# Patient Record
Sex: Male | Born: 1955 | Race: White | Hispanic: Yes | Marital: Married | Smoking: Current every day smoker
Health system: Southern US, Community
[De-identification: ages and names within clinical notes are randomized; demographics above are authoritative.]

## PROBLEM LIST (undated history)

## (undated) DIAGNOSIS — M549 Dorsalgia, unspecified: Secondary | ICD-10-CM

## (undated) DIAGNOSIS — F32A Depression, unspecified: Secondary | ICD-10-CM

## (undated) DIAGNOSIS — N289 Disorder of kidney and ureter, unspecified: Secondary | ICD-10-CM

## (undated) DIAGNOSIS — E119 Type 2 diabetes mellitus without complications: Secondary | ICD-10-CM

## (undated) DIAGNOSIS — I10 Essential (primary) hypertension: Secondary | ICD-10-CM

## (undated) DIAGNOSIS — F329 Major depressive disorder, single episode, unspecified: Secondary | ICD-10-CM

## (undated) HISTORY — PX: BACK SURGERY: SHX140

---

## 2008-10-25 ENCOUNTER — Ambulatory Visit: Payer: Self-pay | Admitting: Vascular Surgery

## 2009-06-06 ENCOUNTER — Inpatient Hospital Stay (HOSPITAL_COMMUNITY): Admission: RE | Admit: 2009-06-06 | Discharge: 2009-06-09 | Payer: Self-pay | Admitting: Neurosurgery

## 2009-06-06 ENCOUNTER — Ambulatory Visit: Payer: Self-pay | Admitting: Vascular Surgery

## 2009-06-10 ENCOUNTER — Inpatient Hospital Stay (HOSPITAL_COMMUNITY): Admission: EM | Admit: 2009-06-10 | Discharge: 2009-06-13 | Payer: Self-pay | Admitting: Emergency Medicine

## 2009-06-21 ENCOUNTER — Ambulatory Visit (HOSPITAL_COMMUNITY): Admission: RE | Admit: 2009-06-21 | Discharge: 2009-06-21 | Payer: Self-pay | Admitting: Neurosurgery

## 2009-06-21 ENCOUNTER — Ambulatory Visit: Payer: Self-pay | Admitting: Vascular Surgery

## 2009-06-21 ENCOUNTER — Encounter (INDEPENDENT_AMBULATORY_CARE_PROVIDER_SITE_OTHER): Payer: Self-pay | Admitting: Neurosurgery

## 2009-07-05 ENCOUNTER — Ambulatory Visit: Payer: Self-pay | Admitting: Vascular Surgery

## 2009-07-21 ENCOUNTER — Emergency Department (HOSPITAL_COMMUNITY): Admission: EM | Admit: 2009-07-21 | Discharge: 2009-07-21 | Payer: Self-pay | Admitting: Emergency Medicine

## 2009-10-25 ENCOUNTER — Ambulatory Visit (HOSPITAL_COMMUNITY): Admission: RE | Admit: 2009-10-25 | Discharge: 2009-10-25 | Payer: Self-pay | Admitting: Neurosurgery

## 2009-10-25 ENCOUNTER — Encounter (INDEPENDENT_AMBULATORY_CARE_PROVIDER_SITE_OTHER): Payer: Self-pay | Admitting: Neurosurgery

## 2009-11-01 ENCOUNTER — Ambulatory Visit: Payer: Self-pay | Admitting: Vascular Surgery

## 2010-01-21 ENCOUNTER — Ambulatory Visit: Payer: Self-pay | Admitting: Diagnostic Radiology

## 2010-01-21 ENCOUNTER — Emergency Department (HOSPITAL_BASED_OUTPATIENT_CLINIC_OR_DEPARTMENT_OTHER): Admission: EM | Admit: 2010-01-21 | Discharge: 2010-01-21 | Payer: Self-pay | Admitting: Emergency Medicine

## 2010-04-15 ENCOUNTER — Emergency Department (HOSPITAL_COMMUNITY): Admission: EM | Admit: 2010-04-15 | Discharge: 2010-04-15 | Payer: Self-pay | Admitting: Emergency Medicine

## 2010-12-13 LAB — URINALYSIS, ROUTINE W REFLEX MICROSCOPIC
Bilirubin Urine: NEGATIVE
Glucose, UA: NEGATIVE mg/dL
Leukocytes, UA: NEGATIVE
Urobilinogen, UA: 0.2 mg/dL (ref 0.0–1.0)
pH: 6 (ref 5.0–8.0)

## 2011-01-01 LAB — URINALYSIS, ROUTINE W REFLEX MICROSCOPIC
Ketones, ur: NEGATIVE mg/dL
Nitrite: NEGATIVE
Protein, ur: NEGATIVE mg/dL
Specific Gravity, Urine: 1.017 (ref 1.005–1.030)
pH: 6.5 (ref 5.0–8.0)

## 2011-01-01 LAB — DIFFERENTIAL
Basophils Absolute: 0 10*3/uL (ref 0.0–0.1)
Basophils Relative: 1 % (ref 0–1)
Neutro Abs: 3.4 10*3/uL (ref 1.7–7.7)
Neutrophils Relative %: 56 % (ref 43–77)

## 2011-01-01 LAB — CBC
HCT: 37.5 % — ABNORMAL LOW (ref 39.0–52.0)
Hemoglobin: 12.8 g/dL — ABNORMAL LOW (ref 13.0–17.0)
MCHC: 34.2 g/dL (ref 30.0–36.0)
RBC: 4.11 MIL/uL — ABNORMAL LOW (ref 4.22–5.81)
WBC: 6.1 10*3/uL (ref 4.0–10.5)

## 2011-01-01 LAB — SEDIMENTATION RATE: Sed Rate: 18 mm/hr — ABNORMAL HIGH (ref 0–16)

## 2011-01-01 LAB — POCT I-STAT, CHEM 8: Creatinine, Ser: 0.9 mg/dL (ref 0.4–1.5)

## 2011-01-02 LAB — COMPREHENSIVE METABOLIC PANEL
ALT: 31 U/L (ref 0–53)
AST: 22 U/L (ref 0–37)
Alkaline Phosphatase: 49 U/L (ref 39–117)
CO2: 27 mEq/L (ref 19–32)
Calcium: 8.6 mg/dL (ref 8.4–10.5)
GFR calc Af Amer: >60 mL/min (ref >60)
GFR calc non Af Amer: >60 mL/min (ref >60)
Glucose, Bld: 111 mg/dL — ABNORMAL HIGH (ref 70–99)
Potassium: 3.9 mEq/L (ref 3.5–5.1)
Sodium: 131 mEq/L — ABNORMAL LOW (ref 135–145)
Total Protein: 6.6 g/dL (ref 6.0–8.3)

## 2011-01-02 LAB — CULTURE, BLOOD (ROUTINE X 2)
Culture: NO GROWTH
Culture: NO GROWTH

## 2011-01-02 LAB — BASIC METABOLIC PANEL
BUN: 7 mg/dL (ref 6–23)
BUN: 4 mg/dL — ABNORMAL LOW (ref 6–23)
CO2: 27 mEq/L (ref 19–32)
Calcium: 8.3 mg/dL — ABNORMAL LOW (ref 8.4–10.5)
Calcium: 8 mg/dL — ABNORMAL LOW (ref 8.4–10.5)
Calcium: 8.5 mg/dL (ref 8.4–10.5)
Chloride: 95 mEq/L — ABNORMAL LOW (ref 96–112)
Creatinine, Ser: 0.93 mg/dL (ref 0.4–1.5)
GFR calc Af Amer: >60 mL/min (ref >60)
GFR calc non Af Amer: >60 mL/min (ref >60)
GFR calc non Af Amer: >60 mL/min (ref >60)
Glucose, Bld: 110 mg/dL — ABNORMAL HIGH (ref 70–99)
Glucose, Bld: 137 mg/dL — ABNORMAL HIGH (ref 70–99)
Potassium: 3.9 mEq/L (ref 3.5–5.1)
Potassium: 4.3 mEq/L (ref 3.5–5.1)
Sodium: 129 mEq/L — ABNORMAL LOW (ref 135–145)
Sodium: 129 mEq/L — ABNORMAL LOW (ref 135–145)

## 2011-01-02 LAB — CBC
HCT: 29.9 % — ABNORMAL LOW (ref 39.0–52.0)
HCT: 41.3 % (ref 39.0–52.0)
Hemoglobin: 10 g/dL — ABNORMAL LOW (ref 13.0–17.0)
Hemoglobin: 10.5 g/dL — ABNORMAL LOW (ref 13.0–17.0)
Hemoglobin: 11.6 g/dL — ABNORMAL LOW (ref 13.0–17.0)
Hemoglobin: 14 g/dL (ref 13.0–17.0)
Hemoglobin: 9.9 g/dL — ABNORMAL LOW (ref 13.0–17.0)
MCHC: 33.7 g/dL (ref 30.0–36.0)
MCHC: 33.8 g/dL (ref 30.0–36.0)
Platelets: 446 10*3/uL — ABNORMAL HIGH (ref 150–400)
Platelets: 357 10*3/uL (ref 150–400)
RBC: 3.02 MIL/uL — ABNORMAL LOW (ref 4.22–5.81)
RBC: 3.35 MIL/uL — ABNORMAL LOW (ref 4.22–5.81)
RBC: 3.69 MIL/uL — ABNORMAL LOW (ref 4.22–5.81)
RBC: 4.47 MIL/uL (ref 4.22–5.81)
RDW: 13.4 % (ref 11.5–15.5)
RDW: 13.4 % (ref 11.5–15.5)
RDW: 13.5 % (ref 11.5–15.5)
WBC: 7.1 10*3/uL (ref 4.0–10.5)
WBC: 11.1 10*3/uL — ABNORMAL HIGH (ref 4.0–10.5)
WBC: 7.2 10*3/uL (ref 4.0–10.5)
WBC: 8.9 10*3/uL (ref 4.0–10.5)
WBC: 9.8 10*3/uL (ref 4.0–10.5)

## 2011-01-02 LAB — DIFFERENTIAL
Basophils Absolute: 0 10*3/uL (ref 0.0–0.1)
Basophils Relative: 0 % (ref 0–1)
Eosinophils Absolute: 0.4 10*3/uL (ref 0.0–0.7)
Eosinophils Relative: 4 % (ref 0–5)
Lymphocytes Relative: 15 % (ref 12–46)
Lymphs Abs: 1.1 10*3/uL (ref 0.7–4.0)
Lymphs Abs: 1.8 10*3/uL (ref 0.7–4.0)
Monocytes Absolute: 0.9 10*3/uL (ref 0.1–1.0)
Monocytes Relative: 13 % — ABNORMAL HIGH (ref 3–12)
Monocytes Relative: 13 % — ABNORMAL HIGH (ref 3–12)
Neutro Abs: 4.6 10*3/uL (ref 1.7–7.7)
Neutrophils Relative %: 65 % (ref 43–77)

## 2011-01-02 LAB — URINALYSIS, ROUTINE W REFLEX MICROSCOPIC
Bilirubin Urine: NEGATIVE
Glucose, UA: NEGATIVE mg/dL
Specific Gravity, Urine: 1.007 (ref 1.005–1.030)
pH: 8 (ref 5.0–8.0)

## 2011-01-02 LAB — URINE MICROSCOPIC-ADD ON

## 2011-01-02 LAB — TYPE AND SCREEN: Antibody Screen: NEGATIVE

## 2011-02-10 NOTE — Assessment & Plan Note (Signed)
OFFICE VISIT   Jeffery Shaffer, Jeffery Shaffer  DOB:  May 26, 1956                                       07/05/2009  CHART#:[id number]   This patient presents today for follow-up of his anterior exposure for  L4-5 lumbar fusion by Dr. Tressie Stalker.  This was on 06/06/2009.  He  was readmitted for fever, abdominal pain on the 09/13 and was discharged  on 09/16.  CT at that time showed some edema and typical postoperative  findings.  He was discharged on Cipro and Tylox for pain and I am seeing  him for further follow-up.   His abdominal incision is completely healed.  He has no evidence of  erythema or fluctuance.  He does have persistent discomfort around this  area.  He reports that the Tylox causes constipation and upset stomach.  He also complains of some persistent pain across his low back and also  some pain in his left leg.  He reports this was present preoperatively.   I discussed this at length with the patient and his family present via  interpreter, although he does understand English quite well.  He was  written a prescription for Ultram with hopefully less GI difficulty for  him.  He is to see Dr. Lovell Sheehan on 10/12 for further discussion regarding  his recover from his back surgery.  He will see [place] again on an as needed  basis.   Larina Earthly, M.D.  Electronically Signed   TFE/MEDQ  D:  07/05/2009  T:  07/09/2009  Job:  [id number]   cc:   Cristi Loron, M.D.

## 2011-02-10 NOTE — Assessment & Plan Note (Signed)
OFFICE VISIT   Jeffery Shaffer, Jeffery Shaffer  DOB:  01-20-1956                                       11/01/2009  FAOZH#:[id number]   The patient presents today for evaluation of diffuse pain.  He is status  post single level ALIF procedure at the L4-5 level on 06/06/2009 with  Dr. Delma Officer.  He has had persistent difficulty since that time.  He  has multiple complaints today, mostly of diffuse lower abdominal pain  and he demonstrates an area below the belt line diffusely across his  lower abdomen.  He also reports that he has pain extending into his  testicles more so on the left than on the right.  He says there is  pressure associated with this as well.  He also reports that he has had  discomfort down into both lower extremities and has some superficial  ulcerations over both lower extremities.   PHYSICAL EXAMINATION:  His dorsalis pedis pulses are 2+ bilaterally.  He  does not have any edema on both lower extremities.  His abdominal  incision is well-healed with no evidence of hernia.  He had undergone a  recent arterial and venous Doppler study at [facility] and this  is reported as negative, although I have not seen the actual study.  The  patient reports that he becoming very depressed regarding the prolonged  discomfort that he is having.  I explained that I certainly understand  this but do not have any explanation for his ongoing discomfort.  He has  seen urologic consultation as well and is felt to have no urologic cause  for his discomfort.  He will continue to follow up with Dr. Lovell Sheehan as  planned.     Larina Earthly, M.D.  Electronically Signed   TFE/MEDQ  D:  11/01/2009  T:  11/04/2009  Job:  [id number]   cc:   Cristi Loron, M.D.

## 2011-08-09 IMAGING — CT CT PELVIS W/ CM
1 of 6 series · 12 of 32 positions shown, 17 images · IV contrast (water & 100 ML OMNI 300)
Comparison: Acute abdominal series 06/10/2009.

CT ABDOMEN

CLINICAL DATA: Diffuse abdominal pain.  Status post lumbar fusion.
Possible small bowel obstruction.

CT ABDOMEN AND PELVIS WITH CONTRAST
TECHNIQUE: Multidetector CT imaging of the abdomen and pelvis was
performed using the standard protocol following bolus
administration of intravenous contrast.
Contrast: 100 ml Omnipaque 300.

[Series 4: recon 3: routine abdomen · axial · 0.98mm/px · z∈[-476,-39]mm · 12 of 400 slices shown, 17 images]
[im 25/400  soft-tissue]
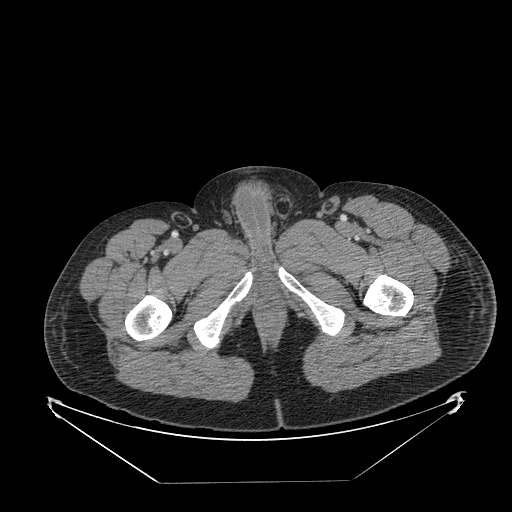
[im 25/400  bone]
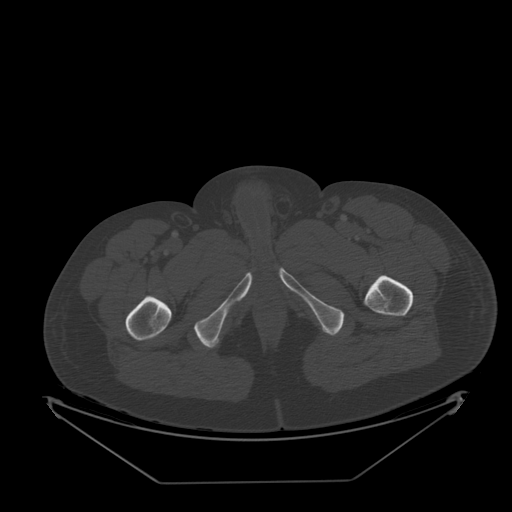
[im 75/400  soft-tissue]
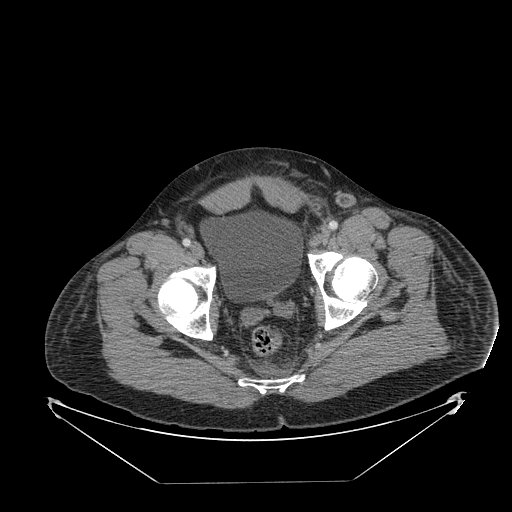
[im 100/400  soft-tissue]
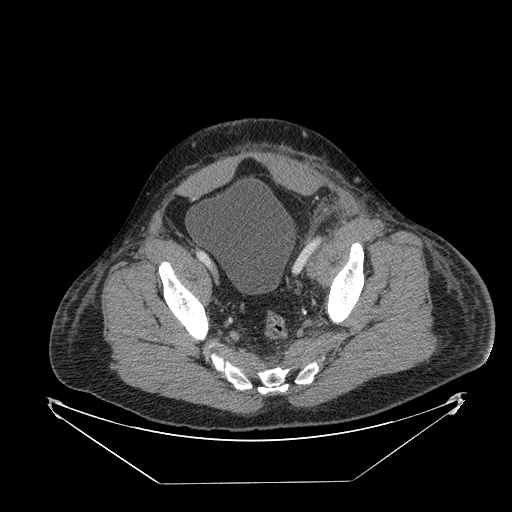
[im 125/400  soft-tissue]
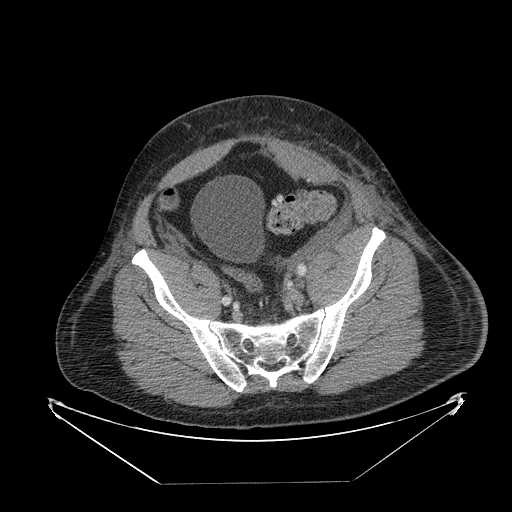
[im 175/400  soft-tissue]
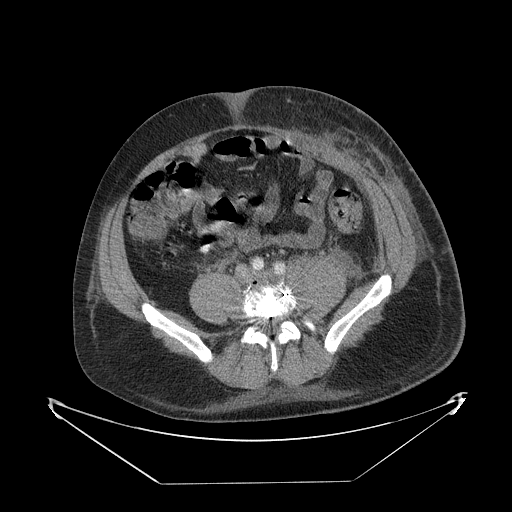
[im 200/400  soft-tissue]
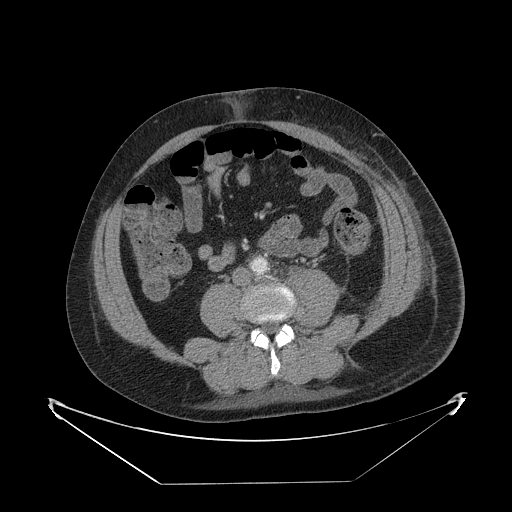
[im 225/400  soft-tissue]
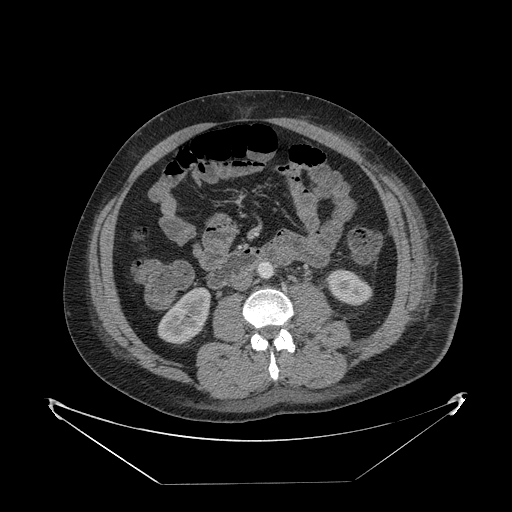
[im 275/400  soft-tissue]
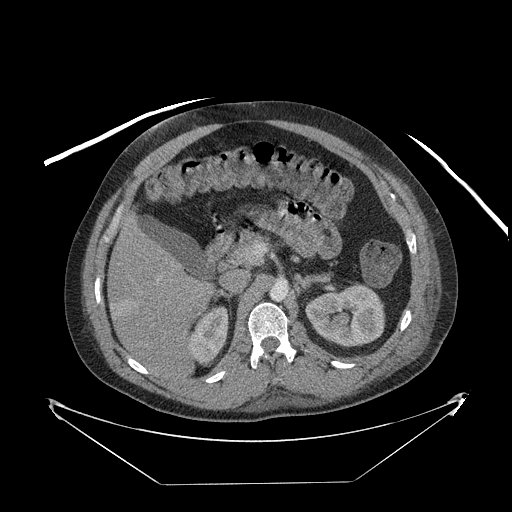
[im 300/400  soft-tissue]
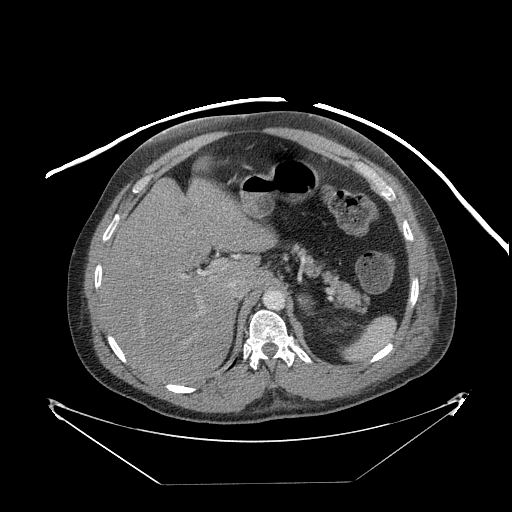
[im 300/400  lung]
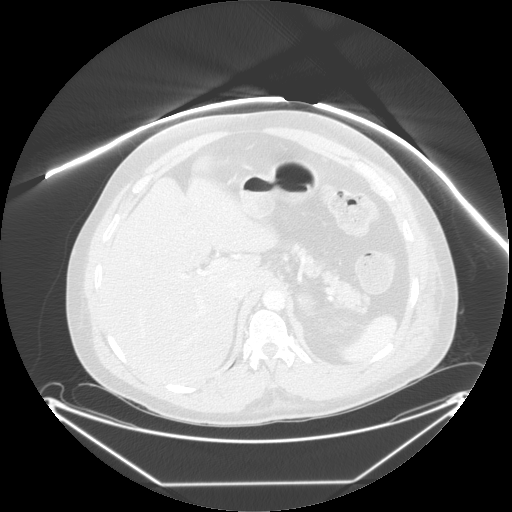
[im 300/400  bone]
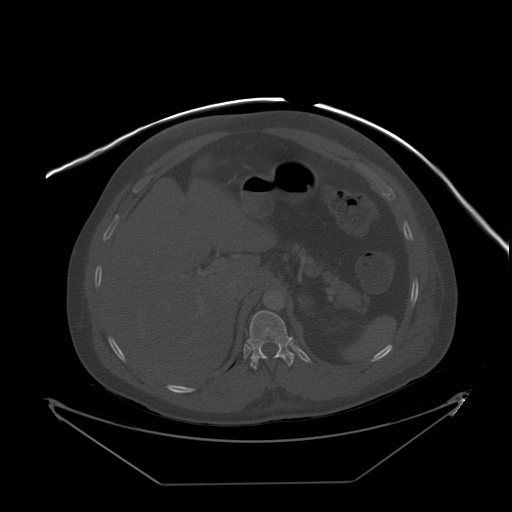
[im 325/400  soft-tissue]
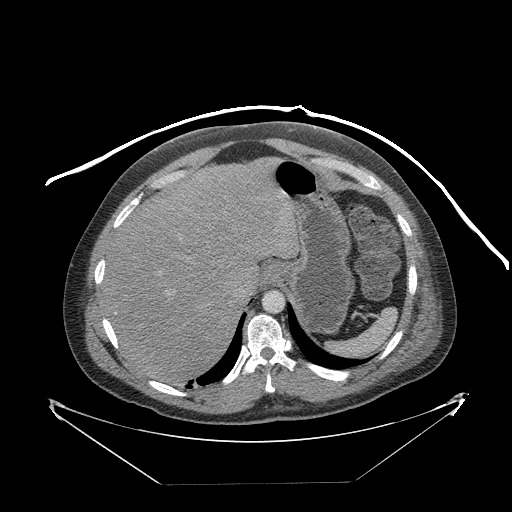
[im 325/400  lung]
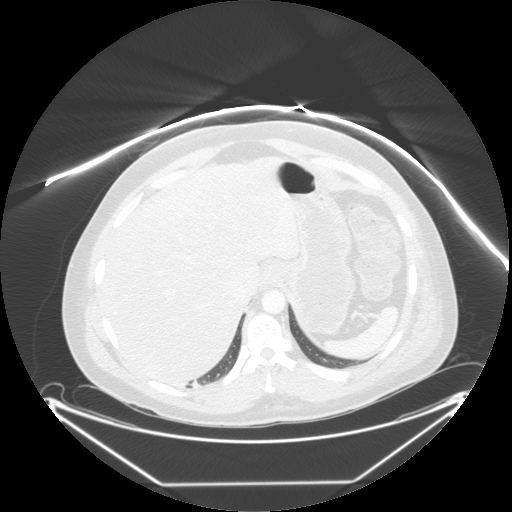
[im 350/400  lung]
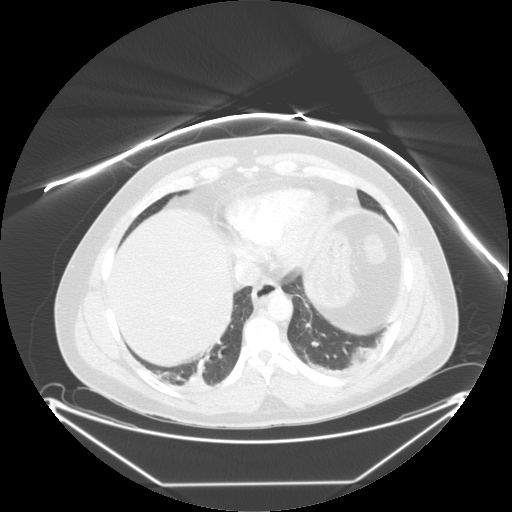
[im 375/400  soft-tissue]
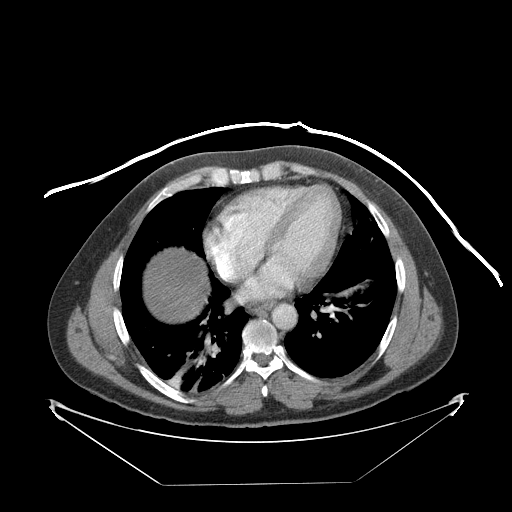
[im 375/400  lung]
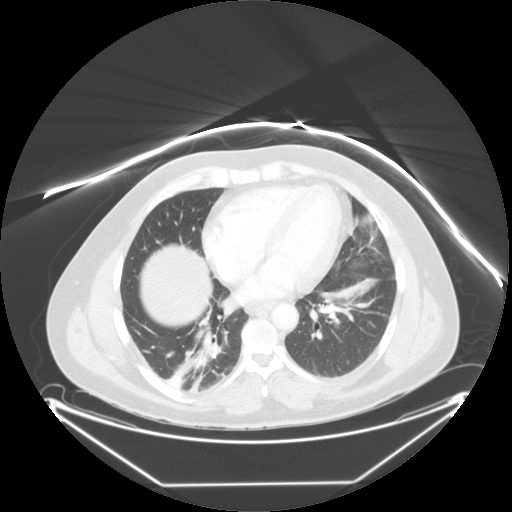

[12 of 32 positions shown; findings below may reference images not displayed]

FINDINGS: There is linear airspace disease within the right lower
lobe.  Minimal atelectasis is present at the left lower lobe as
well.  Heart size is normal.  There is no significant pleural or
pericardial effusion.

There is probable diffuse fatty infiltration of the liver.  A
single subsegmental cystic lesion is present within the left lobe
on image number 21 of series 2.  The spleen is small, but within
normal limits.  The stomach, duodenum, and pancreas are within
normal limits.  The common bile duct and gallbladder are
unremarkable.  The adrenal glands and kidneys are normal
bilaterally.  There are air-fluid levels within mildly dilated
loops of small bowel in the mid abdomen, 2.8 cm.  There is no
obstruction or free air.

The patient is status post recent anterior fusion of L4-5.  The
approach was from the left lower quadrant.  There is a fluid gas
collection within the subcutaneous fat.  There is no definite
peripheral enhancement or sequestration to suggest abscess.  There
is fluid within the retroperitoneal space on the left.  These
findings may be within normal limits following surgery.
IMPRESSION: 1.  Volume loss in the lung bases bilaterally, right worse left.
This likely represents atelectasis.  Infection is not excluded at
the right lung base.
2.  Probable diffuse fatty infiltration of the liver.
3.  Mild dilation of loops of small bowel with air-fluid levels
compatible with an ileus.  There is no evidence for obstruction or
free air.
4.  Postoperative changes of anterior fusion at L4-5 via a left
lower quadrant anterior approach.
6.  Fluid and gas collection within subcutaneous soft tissues.
While this could be within normal limits, it is worrisome for
developing infection and potential abscess.

CT PELVIS
FINDINGS: The rectosigmoid colon is within normal limits.  The
appendix is visualized and normal.  Urinary bladder is
unremarkable.  As stated above, there is retroperitoneal fluid on
the left and some inflammatory change surrounding left psoas
muscle.  There is some gas within the anterior paravertebral space,
compatible with sinus surgery.  Atherosclerotic calcifications are
noted within the distal aorta and iliac vessels without aneurysm.
Small reactive sized inguinal lymph nodes are present bilaterally.
There is fat herniating into the left inguinal canal without bowel.
IMPRESSION: 1.  Fluid and gas collection within the subcutaneous soft tissues
of the left lower quadrant to is concerning for infection and
potential developing abscess.
2.  Fluid within the retroperitoneal space and gas within the
paravertebral space may be within normal limits following recent
surgery.
3.  Bilateral inguinal lymph nodes are likely reactive.
4.  Fat herniates into the left inguinal canal without bowel.

## 2012-07-26 ENCOUNTER — Other Ambulatory Visit: Payer: Self-pay | Admitting: Neurosurgery

## 2012-07-26 DIAGNOSIS — M549 Dorsalgia, unspecified: Secondary | ICD-10-CM

## 2013-10-21 ENCOUNTER — Emergency Department (HOSPITAL_COMMUNITY): Payer: Medicare Other

## 2013-10-21 ENCOUNTER — Emergency Department (HOSPITAL_COMMUNITY)
Admission: EM | Admit: 2013-10-21 | Discharge: 2013-10-21 | Disposition: A | Discharge status: Home / Self Care | Payer: Medicare Other | Attending: Emergency Medicine | Admitting: Emergency Medicine

## 2013-10-21 ENCOUNTER — Encounter (HOSPITAL_COMMUNITY): Payer: Self-pay | Admitting: Emergency Medicine

## 2013-10-21 DIAGNOSIS — F329 Major depressive disorder, single episode, unspecified: Secondary | ICD-10-CM | POA: Insufficient documentation

## 2013-10-21 DIAGNOSIS — I10 Essential (primary) hypertension: Secondary | ICD-10-CM | POA: Insufficient documentation

## 2013-10-21 DIAGNOSIS — Z87448 Personal history of other diseases of urinary system: Secondary | ICD-10-CM | POA: Insufficient documentation

## 2013-10-21 DIAGNOSIS — F17200 Nicotine dependence, unspecified, uncomplicated: Secondary | ICD-10-CM | POA: Insufficient documentation

## 2013-10-21 DIAGNOSIS — R079 Chest pain, unspecified: Secondary | ICD-10-CM | POA: Insufficient documentation

## 2013-10-21 DIAGNOSIS — Z79899 Other long term (current) drug therapy: Secondary | ICD-10-CM | POA: Insufficient documentation

## 2013-10-21 DIAGNOSIS — F3289 Other specified depressive episodes: Secondary | ICD-10-CM | POA: Insufficient documentation

## 2013-10-21 DIAGNOSIS — R300 Dysuria: Secondary | ICD-10-CM | POA: Insufficient documentation

## 2013-10-21 DIAGNOSIS — E785 Hyperlipidemia, unspecified: Secondary | ICD-10-CM | POA: Insufficient documentation

## 2013-10-21 DIAGNOSIS — R112 Nausea with vomiting, unspecified: Secondary | ICD-10-CM | POA: Insufficient documentation

## 2013-10-21 DIAGNOSIS — Z791 Long term (current) use of non-steroidal anti-inflammatories (NSAID): Secondary | ICD-10-CM | POA: Insufficient documentation

## 2013-10-21 DIAGNOSIS — R109 Unspecified abdominal pain: Secondary | ICD-10-CM | POA: Insufficient documentation

## 2013-10-21 DIAGNOSIS — E119 Type 2 diabetes mellitus without complications: Secondary | ICD-10-CM | POA: Insufficient documentation

## 2013-10-21 HISTORY — DX: Dorsalgia, unspecified: M54.9

## 2013-10-21 HISTORY — DX: Essential (primary) hypertension: I10

## 2013-10-21 HISTORY — DX: Major depressive disorder, single episode, unspecified: F32.9

## 2013-10-21 HISTORY — DX: Depression, unspecified: F32.A

## 2013-10-21 HISTORY — DX: Type 2 diabetes mellitus without complications: E11.9

## 2013-10-21 HISTORY — DX: Disorder of kidney and ureter, unspecified: N28.9

## 2013-10-21 LAB — URINE MICROSCOPIC-ADD ON

## 2013-10-21 LAB — HEPATIC FUNCTION PANEL
ALT: 26 U/L (ref 0–53)
AST: 17 U/L (ref 0–37)
Albumin: 3.7 g/dL (ref 3.5–5.2)
Alkaline Phosphatase: 51 U/L (ref 39–117)
BILIRUBIN TOTAL: 0.2 mg/dL — AB (ref 0.3–1.2)
Bilirubin, Direct: <0.2 mg/dL (ref 0.0–0.3)
Total Protein: 7.5 g/dL (ref 6.0–8.3)

## 2013-10-21 LAB — CBC
HEMATOCRIT: 39.3 % (ref 39.0–52.0)
Hemoglobin: 13.7 g/dL (ref 13.0–17.0)
MCH: 30 pg (ref 26.0–34.0)
MCHC: 34.9 g/dL (ref 30.0–36.0)
MCV: 86.2 fL (ref 78.0–100.0)
PLATELETS: 284 10*3/uL (ref 150–400)
RBC: 4.56 MIL/uL (ref 4.22–5.81)
RDW: 12.6 % (ref 11.5–15.5)
WBC: 7.1 10*3/uL (ref 4.0–10.5)

## 2013-10-21 LAB — URINALYSIS, ROUTINE W REFLEX MICROSCOPIC
BILIRUBIN URINE: NEGATIVE
Glucose, UA: NEGATIVE mg/dL
Ketones, ur: NEGATIVE mg/dL
Leukocytes, UA: NEGATIVE
NITRITE: NEGATIVE
PROTEIN: NEGATIVE mg/dL
SPECIFIC GRAVITY, URINE: 1.011 (ref 1.005–1.030)
Urobilinogen, UA: 0.2 mg/dL (ref 0.0–1.0)
pH: 6.5 (ref 5.0–8.0)

## 2013-10-21 LAB — POCT I-STAT TROPONIN I: Troponin i, poc: 0 ng/mL (ref 0.00–0.08)

## 2013-10-21 LAB — BASIC METABOLIC PANEL
BUN: 12 mg/dL (ref 6–23)
CALCIUM: 9.3 mg/dL (ref 8.4–10.5)
CO2: 21 mEq/L (ref 19–32)
Chloride: 98 mEq/L (ref 96–112)
Creatinine, Ser: 0.91 mg/dL (ref 0.50–1.35)
GFR calc Af Amer: >90 mL/min (ref >90)
GFR calc non Af Amer: >90 mL/min (ref >90)
Glucose, Bld: 105 mg/dL — ABNORMAL HIGH (ref 70–99)
Potassium: 4.7 mEq/L (ref 3.7–5.3)
Sodium: 132 mEq/L — ABNORMAL LOW (ref 137–147)

## 2013-10-21 LAB — LIPASE, BLOOD: LIPASE: 50 U/L (ref 11–59)

## 2013-10-21 MED ORDER — INDOMETHACIN ER 75 MG PO CPCR: 75.0000 mg | ORAL_CAPSULE | Freq: Every day | ORAL | Status: AC

## 2013-10-21 MED ORDER — KETOROLAC TROMETHAMINE 15 MG/ML IJ SOLN
15.0000 mg | Freq: Once | INTRAMUSCULAR | Status: AC
  Administered 2013-10-21: 15 mg via INTRAVENOUS
  Filled 2013-10-21: qty 1

## 2013-10-21 MED ORDER — MORPHINE SULFATE 4 MG/ML IJ SOLN
4.0000 mg | Freq: Once | INTRAMUSCULAR | Status: AC
  Administered 2013-10-21: 4 mg via INTRAVENOUS
  Filled 2013-10-21: qty 1

## 2013-10-21 MED ORDER — COLCHICINE 0.6 MG PO TABS: 0.6000 mg | ORAL_TABLET | Freq: Two times a day (BID) | ORAL | Status: AC

## 2013-10-21 MED ORDER — HYDROMORPHONE HCL PF 1 MG/ML IJ SOLN
1.0000 mg | Freq: Once | INTRAMUSCULAR | Status: AC
  Administered 2013-10-21: 1 mg via INTRAVENOUS
  Filled 2013-10-21: qty 1

## 2013-10-21 MED ORDER — IOHEXOL 350 MG/ML SOLN
100.0000 mL | Freq: Once | INTRAVENOUS | Status: AC | PRN
  Administered 2013-10-21: 100 mL via INTRAVENOUS

## 2013-10-21 MED ORDER — SODIUM CHLORIDE 0.9 % IV BOLUS (SEPSIS)
1000.0000 mL | Freq: Once | INTRAVENOUS | Status: AC
  Administered 2013-10-21: 1000 mL via INTRAVENOUS

## 2013-10-21 NOTE — Discharge Instructions (Signed)
Chest Pain (Nonspecific) °It is often hard to give a specific diagnosis for the cause of chest pain. There is always a chance that your pain could be related to something serious, such as a heart attack or a blood clot in the lungs. You need to follow up with your caregiver for further evaluation. °CAUSES  °· Heartburn. °· Pneumonia or bronchitis. °· Anxiety or stress. °· Inflammation around your heart (pericarditis) or lung (pleuritis or pleurisy). °· A blood clot in the lung. °· A collapsed lung (pneumothorax). It can develop suddenly on its own (spontaneous pneumothorax) or from injury (trauma) to the chest. °· Shingles infection (herpes zoster virus). °The chest wall is composed of bones, muscles, and cartilage. Any of these can be the source of the pain. °· The bones can be bruised by injury. °· The muscles or cartilage can be strained by coughing or overwork. °· The cartilage can be affected by inflammation and become sore (costochondritis). °DIAGNOSIS  °Lab tests or other studies, such as X-rays, electrocardiography, stress testing, or cardiac imaging, may be needed to find the cause of your pain.  °TREATMENT  °· Treatment depends on what may be causing your chest pain. Treatment may include: °· Acid blockers for heartburn. °· Anti-inflammatory medicine. °· Pain medicine for inflammatory conditions. °· Antibiotics if an infection is present. °· You may be advised to change lifestyle habits. This includes stopping smoking and avoiding alcohol, caffeine, and chocolate. °· You may be advised to keep your head raised (elevated) when sleeping. This reduces the chance of acid going backward from your stomach into your esophagus. °· Most of the time, nonspecific chest pain will improve within 2 to 3 days with rest and mild pain medicine. °HOME CARE INSTRUCTIONS  °· If antibiotics were prescribed, take your antibiotics as directed. Finish them even if you start to feel better. °· For the next few days, avoid physical  activities that bring on chest pain. Continue physical activities as directed. °· Do not smoke. °· Avoid drinking alcohol. °· Only take over-the-counter or prescription medicine for pain, discomfort, or fever as directed by your caregiver. °· Follow your caregiver's suggestions for further testing if your chest pain does not go away. °· Keep any follow-up appointments you made. If you do not go to an appointment, you could develop lasting (chronic) problems with pain. If there is any problem keeping an appointment, you must call to reschedule. °SEEK MEDICAL CARE IF:  °· You think you are having problems from the medicine you are taking. Read your medicine instructions carefully. °· Your chest pain does not go away, even after treatment. °· You develop a rash with blisters on your chest. °SEEK IMMEDIATE MEDICAL CARE IF:  °· You have increased chest pain or pain that spreads to your arm, neck, jaw, back, or abdomen. °· You develop shortness of breath, an increasing cough, or you are coughing up blood. °· You have severe back or abdominal pain, feel nauseous, or vomit. °· You develop severe weakness, fainting, or chills. °· You have a fever. °THIS IS AN EMERGENCY. Do not wait to see if the pain will go away. Get medical help at once. Call your local emergency services (911 in [place] Do not drive yourself to the hospital. °MAKE SURE YOU:  °· Understand these instructions. °· Will watch your condition. °· Will get help right away if you are not doing well or get worse. °Document Released: 06/24/2005 Document Revised: 12/07/2011 Document Reviewed: 04/19/2008 °[employer]® Patient Information ©2014 [employer]   [employer]. ° °

## 2013-10-21 NOTE — ED Provider Notes (Signed)
CSN: [id number]     Arrival date & time 10/21/13  1108 History   First MD Initiated Contact with Patient 10/21/13 1114     Chief Complaint  Patient presents with  . Chest Pain   (Consider location/radiation/quality/duration/timing/severity/associated sxs/prior Treatment) HPI Comments: Patient presents to the ED with a chief complaint of chest pain x 1 week.  He states that the pain has progressively worsened.  He denies any falls or injuries.  He has a history of HTN, DM, HL, and smoking history.  Patient states that the pain is worse when he lies down.  He endorses associated SOB but denies diaphoresis.  He states that the pain is severe.  He is also complaining of dysuria and hesitancy.  He reports having a couple of episodes of non-bloody diarrhea.  No vomiting.  Has tried taking OTC pain meds with no relief.  Additionally, patient complains of chronic back pain following a surgery 5 years ago.  The history is provided by the patient. No language interpreter was used.    Past Medical History  Diagnosis Date  . Renal disorder   . Hypertension   . Diabetes mellitus without complication   . Depression   . Back pain    Past Surgical History  Procedure Laterality Date  . Back surgery     No family history on file. History  Substance Use Topics  . Smoking status: Current Every Day Smoker -- 0.50 packs/day    Types: Cigarettes  . Smokeless tobacco: Not on file  . Alcohol Use: No    Review of Systems  All other systems reviewed and are negative.    Allergies  Aspirin and Ibuprofen  Home Medications   Current Outpatient Rx  Name  Route  Sig  Dispense  Refill  . atenolol (TENORMIN) 25 MG tablet   Oral   Take 25 mg by mouth daily.         . celecoxib (CELEBREX) 200 MG capsule   Oral   Take 200 mg by mouth daily.         . clonazePAM (KLONOPIN) 2 MG tablet   Oral   Take 1 mg by mouth 4 (four) times daily as needed for anxiety.         Marland Kitchen FLUoxetine (PROZAC) 10  MG capsule   Oral   Take 10 mg by mouth daily.         . hydrOXYzine (VISTARIL) 50 MG capsule   Oral   Take 50 mg by mouth 3 (three) times daily as needed for anxiety.         Marland Kitchen lisinopril (PRINIVIL,ZESTRIL) 20 MG tablet   Oral   Take 20 mg by mouth daily.         Marland Kitchen lovastatin (MEVACOR) 20 MG tablet   Oral   Take 20 mg by mouth at bedtime.         Marland Kitchen omeprazole (PRILOSEC) 40 MG capsule   Oral   Take 40 mg by mouth daily.         . Oxycodone HCl 10 MG TABS   Oral   Take 10 mg by mouth every 6 (six) hours as needed (pain).          BP 150/89  Pulse 69  Temp(Src) 98.1 F (36.7 C) (Oral)  Resp 22  SpO2 95% Physical Exam  Nursing note and vitals reviewed. Constitutional: He is oriented to person, place, and time. He appears well-developed and well-nourished.  HENT:  Head: Normocephalic  and atraumatic.  Eyes: Conjunctivae and EOM are normal. Pupils are equal, round, and reactive to light. Right eye exhibits no discharge. Left eye exhibits no discharge. No scleral icterus.  Neck: Normal range of motion. Neck supple. No JVD present.  Cardiovascular: Normal rate, regular rhythm, normal heart sounds and intact distal pulses.  Exam reveals no gallop and no friction rub.   No murmur heard. Pulmonary/Chest: Effort normal and breath sounds normal. No respiratory distress. He has no wheezes. He has no rales. He exhibits no tenderness.  Abdominal: Soft. Bowel sounds are normal. He exhibits no distension and no mass. There is tenderness. There is no rebound and no guarding.  Lower abdominal tenderness, non-focal, no fluid wave, no signs of peritonitis  Musculoskeletal: Normal range of motion. He exhibits no edema and no tenderness.  Neurological: He is alert and oriented to person, place, and time.  CN 3-12 intact  Skin: Skin is warm and dry.  Psychiatric: He has a normal mood and affect. His behavior is normal. Judgment and thought content normal.    ED Course   Procedures (including critical care time) Labs Review Labs Reviewed  CBC  BASIC METABOLIC PANEL  LIPASE, BLOOD  HEPATIC FUNCTION PANEL  URINALYSIS, ROUTINE W REFLEX MICROSCOPIC   Imaging Review No results found.  EKG Interpretation    Date/Time:  Saturday October 21 2013 11:13:02 EST Ventricular Rate:  72 PR Interval:  172 QRS Duration: 88 QT Interval:  354 QTC Calculation: 387 R Axis:   66 Text Interpretation:  Normal sinus rhythm Early repolarization Normal ECG Similar to prior Confirmed by [facility]  MD, BLAIR ([id number]) on 10/21/2013 11:22:51 AM            MDM   1. Chest pain    Patient with chest pain.  Cardiac risk factors are HTN, DM, HL, and smoking history.  HEART score is 3.  Severe lower chest and abdominal pain.  Consider dissection.  Will order screening CXR, and likely CT scans.  Also with abdominal pain and dysuria.  Will check urine and abdominal labs.  Consider prostatitis.  2:29 PM Patient discussed with Dr. Gwendolyn GrantWalden, who has also seen the patient.  Labs and imaging are reassuring.  No evidence of infection, dissection, or other emergent process.  Consider pericarditis.  Dr. Gwendolyn GrantWalden recommends consulting cardiology to look at EKG.  2:48 PM Patient discussed with Dr. Mayford Knifeurner from cardiology, who does not see evidence of pericarditis on EKG, but recommends treating as the symptoms are sharp in nature and have been ongoing for the past week.  Discussed with Dr. Gwendolyn GrantWalden, who agrees with the plan, and recommends treatment for pericarditis with NSAIDs and colchicine.  Reports nausea and vomiting with NSAIDs, will try indocin extended release.    Roxy Horsemanobert Yeiren Whitecotton, PA-C 10/21/13 1458

## 2013-10-21 NOTE — ED Notes (Signed)
Interpreter phone used to communicate with patient, PA and myself.

## 2013-10-21 NOTE — ED Provider Notes (Signed)
Medical screening examination/treatment/procedure(s) were conducted as a shared visit with non-physician practitioner(s) and myself.  I personally evaluated the patient during the encounter.  EKG Interpretation    Date/Time:  Saturday October 21 2013 11:13:02 EST Ventricular Rate:  72 PR Interval:  172 QRS Duration: 88 QT Interval:  354 QTC Calculation: 387 R Axis:   66 Text Interpretation:  Normal sinus rhythm Early repolarization Normal ECG Similar to prior Confirmed by Gwendolyn GrantWALDEN  MD, Tyshon Fanning ([id number]) on 10/21/2013 11:22:51 AM             Patient here with abdominal pain, chest pain. Worse on palpation. Patient poor historian for his pain, described as sharp, L sided and across chest and down L flank. No fevers, no N/V. EKG early repolarization, we spoke with Cards if this could be pericarditis as his pain is better with sitting up and worse with lying down. Dr. Mayford Knifeurner states to treat as such. Negative CT Aorta which was done as his pain was in his chest and radiating throughout his abdomen. Stable for discharge.   Dagmar HaitWilliam Yeilin Zweber, MD 10/21/13 [phone]

## 2013-10-21 NOTE — ED Notes (Signed)
Pt appears to be in pain and is grimacing while trying to talk.  Pt states he's had CP for a few days.  Pt points to R and L chest under the nipples and states it feels like he is being stuck or stabbed.

## 2015-11-27 DEATH — deceased
